# Patient Record
Sex: Female | Born: 1985
Health system: Southern US, Community
[De-identification: ages and names within clinical notes are randomized; demographics above are authoritative.]

## PROBLEM LIST (undated history)

## (undated) DIAGNOSIS — G43909 Migraine, unspecified, not intractable, without status migrainosus: Secondary | ICD-10-CM

## (undated) HISTORY — PX: TUBAL LIGATION: SHX77

---

## 2012-08-21 ENCOUNTER — Encounter (HOSPITAL_BASED_OUTPATIENT_CLINIC_OR_DEPARTMENT_OTHER): Payer: Self-pay | Admitting: *Deleted

## 2012-08-21 ENCOUNTER — Emergency Department (HOSPITAL_BASED_OUTPATIENT_CLINIC_OR_DEPARTMENT_OTHER)
Admission: EM | Admit: 2012-08-21 | Discharge: 2012-08-21 | Disposition: A | Discharge status: Home / Self Care | Payer: Self-pay | Attending: Emergency Medicine | Admitting: Emergency Medicine

## 2012-08-21 DIAGNOSIS — M549 Dorsalgia, unspecified: Secondary | ICD-10-CM | POA: Insufficient documentation

## 2012-08-21 DIAGNOSIS — A5901 Trichomonal vulvovaginitis: Secondary | ICD-10-CM | POA: Insufficient documentation

## 2012-08-21 LAB — URINALYSIS, ROUTINE W REFLEX MICROSCOPIC
Protein, ur: NEGATIVE mg/dL
Urobilinogen, UA: 1 mg/dL (ref 0.0–1.0)

## 2012-08-21 LAB — WET PREP, GENITAL

## 2012-08-21 LAB — URINE MICROSCOPIC-ADD ON

## 2012-08-21 MED ORDER — CEFTRIAXONE SODIUM 250 MG IJ SOLR
250.0000 mg | Freq: Once | INTRAMUSCULAR | Status: AC
  Administered 2012-08-21: 250 mg via INTRAMUSCULAR
  Filled 2012-08-21: qty 250

## 2012-08-21 MED ORDER — AZITHROMYCIN 250 MG PO TABS
1000.0000 mg | ORAL_TABLET | Freq: Once | ORAL | Status: AC
  Administered 2012-08-21: 1000 mg via ORAL
  Filled 2012-08-21: qty 4

## 2012-08-21 MED ORDER — METRONIDAZOLE 500 MG PO TABS
500.0000 mg | ORAL_TABLET | Freq: Once | ORAL | Status: AC
  Administered 2012-08-21: 500 mg via ORAL
  Filled 2012-08-21: qty 1

## 2012-08-21 MED ORDER — LIDOCAINE HCL (PF) 1 % IJ SOLN
INTRAMUSCULAR | Status: AC
  Administered 2012-08-21: 5 mL
  Filled 2012-08-21: qty 5

## 2012-08-21 MED ORDER — METRONIDAZOLE 500 MG PO TABS: 500.0000 mg | ORAL_TABLET | Freq: Two times a day (BID) | ORAL | Status: DC

## 2012-08-21 NOTE — ED Provider Notes (Signed)
History     CSN: [id number]  Arrival date & time 08/21/12  1304   None     Chief Complaint  Patient presents with  . Vaginal Discharge  . Dysuria    (Consider location/radiation/quality/duration/timing/severity/associated sxs/prior treatment) HPI Comments: C/o dysuria and vaginal d/c ~ 1 week.  D/c "smells bad".  Onset coincided with end of menses.  Denies prior STD's.  Last intercourse 8 days ago.  Partner denies sxs.  Pt denies fever, hematuria, frequency or urgency.  No other complaints.  Patient is a 26 y.o. female presenting with vaginal discharge and dysuria. The history is provided by the patient. No language interpreter was used.  Vaginal Discharge This is a new problem. The current episode started in the past 7 days. The problem occurs constantly. The problem has been unchanged. Associated symptoms include urinary symptoms. Pertinent negatives include no chills, fever, nausea or vomiting.  Dysuria  Pertinent negatives include no chills, no nausea, no vomiting, no frequency, no hematuria and no urgency.    History reviewed. No pertinent past medical history.  Past Surgical History  Procedure Date  . Cesarean section   . Tubal ligation     History reviewed. No pertinent family history.  History  Substance Use Topics  . Smoking status: Never Smoker   . Smokeless tobacco: Not on file  . Alcohol Use: No    OB History    Grav Para Term Preterm Abortions TAB SAB Ect Mult Living                  Review of Systems  Constitutional: Negative for fever and chills.  Gastrointestinal: Negative for nausea and vomiting.  Genitourinary: Positive for dysuria and vaginal discharge. Negative for urgency, frequency, hematuria, vaginal bleeding, vaginal pain and pelvic pain.  Musculoskeletal: Positive for back pain.  All other systems reviewed and are negative.    Allergies  Review of patient's allergies indicates no known allergies.  Home Medications   Current  Outpatient Rx  Name Route Sig Dispense Refill  . METRONIDAZOLE 500 MG PO TABS Oral Take 1 tablet (500 mg total) by mouth 2 (two) times daily. 14 tablet 0    BP 137/71  Pulse 85  Temp 98.4 F (36.9 C) (Oral)  Resp 16  Ht 5\' 2"  (1.575 m)  Wt 210 lb (95.255 kg)  BMI 38.41 kg/m2  SpO2 100%  LMP 08/15/2012  Physical Exam  Nursing note and vitals reviewed. Constitutional: She is oriented to person, place, and time. She appears well-developed and well-nourished. No distress.  HENT:  Head: Normocephalic and atraumatic.  Eyes: EOM are normal.  Neck: Normal range of motion.  Cardiovascular: Normal rate and regular rhythm.   Pulmonary/Chest: Effort normal.  Abdominal: Soft. She exhibits no distension. There is no tenderness. There is no rigidity, no guarding, no CVA tenderness, no tenderness at McBurney's point and negative Murphy's sign.  Genitourinary: Pelvic exam was performed with patient supine. There is no rash, tenderness, lesion or injury on the right labia. There is no rash, tenderness, lesion or injury on the left labia. Uterus is not tender. Cervix exhibits discharge. Cervix exhibits no motion tenderness. Right adnexum displays no mass, no tenderness and no fullness. Left adnexum displays no mass, no tenderness and no fullness. No erythema, tenderness or bleeding around the vagina. No foreign body around the vagina. No signs of injury around the vagina. Vaginal discharge found.       Significant thick white/yellow d/c accumulated in posterior fornix and present  around cervix.  No CMT or adnexal PT  Musculoskeletal: Normal range of motion. She exhibits no tenderness.       Lumbar back: She exhibits pain. She exhibits normal range of motion and no tenderness.       Back:  Neurological: She is alert and oriented to person, place, and time.  Skin: Skin is warm and dry.  Psychiatric: She has a normal mood and affect. Judgment normal.    ED Course  Procedures (including critical care  time)  Labs Reviewed  URINALYSIS, ROUTINE W REFLEX MICROSCOPIC - Abnormal; Notable for the following:    Leukocytes, UA MODERATE (*)     All other components within normal limits  URINE MICROSCOPIC-ADD ON - Abnormal; Notable for the following:    Squamous Epithelial / LPF FEW (*)     Bacteria, UA FEW (*)     All other components within normal limits  WET PREP, GENITAL - Abnormal; Notable for the following:    Trich, Wet Prep MANY (*)     Clue Cells Wet Prep HPF POC MANY (*)     WBC, Wet Prep HPF POC MANY (*)     All other components within normal limits  PREGNANCY, URINE  GC/CHLAMYDIA PROBE AMP, GENITAL  RPR   No results found.   1. Trichomonas vaginitis       MDM  Given rocephin and zithromax. Many trich on wet prep rx-flagyl 500 mg BID, 14 Have partner treated.        Evalina Field, [place] 08/21/12 [phone]

## 2012-08-21 NOTE — ED Notes (Signed)
Pt c/o painful urination and  vaginal discharge x 2 days

## 2012-08-22 LAB — RPR: RPR Ser Ql: NONREACTIVE

## 2012-08-22 LAB — GC/CHLAMYDIA PROBE AMP, GENITAL: Chlamydia, DNA Probe: NEGATIVE

## 2012-08-25 NOTE — ED Provider Notes (Signed)
Medical screening examination/treatment/procedure(s) were performed by non-physician practitioner and as supervising physician I was immediately available for consultation/collaboration.  Marq Rebello M Zahir Eisenhour, MD 08/25/12 2107 

## 2014-09-01 ENCOUNTER — Encounter (HOSPITAL_BASED_OUTPATIENT_CLINIC_OR_DEPARTMENT_OTHER): Payer: Self-pay

## 2014-09-01 ENCOUNTER — Emergency Department (HOSPITAL_BASED_OUTPATIENT_CLINIC_OR_DEPARTMENT_OTHER)
Admission: EM | Admit: 2014-09-01 | Discharge: 2014-09-01 | Discharge status: Against Medical Advice | Payer: Self-pay | Attending: Emergency Medicine | Admitting: Emergency Medicine

## 2014-09-01 DIAGNOSIS — R109 Unspecified abdominal pain: Secondary | ICD-10-CM | POA: Insufficient documentation

## 2014-09-01 DIAGNOSIS — M545 Low back pain: Secondary | ICD-10-CM | POA: Insufficient documentation

## 2014-09-01 DIAGNOSIS — Z3202 Encounter for pregnancy test, result negative: Secondary | ICD-10-CM | POA: Insufficient documentation

## 2014-09-01 LAB — URINALYSIS, ROUTINE W REFLEX MICROSCOPIC
Bilirubin Urine: NEGATIVE
GLUCOSE, UA: NEGATIVE mg/dL
KETONES UR: NEGATIVE mg/dL
NITRITE: NEGATIVE
PH: 7 (ref 5.0–8.0)
Protein, ur: 100 mg/dL — AB
SPECIFIC GRAVITY, URINE: 1.009 (ref 1.005–1.030)
Urobilinogen, UA: 0.2 mg/dL (ref 0.0–1.0)

## 2014-09-01 LAB — PREGNANCY, URINE: PREG TEST UR: NEGATIVE

## 2014-09-01 LAB — URINE MICROSCOPIC-ADD ON

## 2014-09-01 NOTE — ED Notes (Signed)
Didn't want to wait any longer. Will come back later

## 2014-09-01 NOTE — ED Notes (Addendum)
Patient here with lower abdominal pressure, dysuria and lower back pain x 3 days. Taking AZO with no relief. Also complains of vaginal discharge.

## 2015-07-05 ENCOUNTER — Encounter (HOSPITAL_BASED_OUTPATIENT_CLINIC_OR_DEPARTMENT_OTHER): Payer: Self-pay | Admitting: *Deleted

## 2015-07-05 ENCOUNTER — Emergency Department (HOSPITAL_BASED_OUTPATIENT_CLINIC_OR_DEPARTMENT_OTHER)
Admission: EM | Admit: 2015-07-05 | Discharge: 2015-07-05 | Disposition: A | Discharge status: Home / Self Care | Payer: Self-pay | Attending: Emergency Medicine | Admitting: Emergency Medicine

## 2015-07-05 DIAGNOSIS — Y998 Other external cause status: Secondary | ICD-10-CM | POA: Insufficient documentation

## 2015-07-05 DIAGNOSIS — Y9389 Activity, other specified: Secondary | ICD-10-CM | POA: Insufficient documentation

## 2015-07-05 DIAGNOSIS — S161XXA Strain of muscle, fascia and tendon at neck level, initial encounter: Secondary | ICD-10-CM | POA: Insufficient documentation

## 2015-07-05 DIAGNOSIS — T148XXA Other injury of unspecified body region, initial encounter: Secondary | ICD-10-CM

## 2015-07-05 DIAGNOSIS — Y92410 Unspecified street and highway as the place of occurrence of the external cause: Secondary | ICD-10-CM | POA: Insufficient documentation

## 2015-07-05 MED ORDER — KETOROLAC TROMETHAMINE 60 MG/2ML IM SOLN
60.0000 mg | Freq: Once | INTRAMUSCULAR | Status: AC
  Administered 2015-07-05: 60 mg via INTRAMUSCULAR
  Filled 2015-07-05: qty 2

## 2015-07-05 MED ORDER — METHOCARBAMOL 500 MG PO TABS: 500.0000 mg | ORAL_TABLET | Freq: Two times a day (BID) | ORAL | Status: DC | PRN

## 2015-07-05 MED ORDER — NAPROXEN 500 MG PO TABS: 500.0000 mg | ORAL_TABLET | Freq: Two times a day (BID) | ORAL | Status: DC
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
## 2015-07-05 NOTE — Discharge Instructions (Signed)
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
## 2015-11-06 ENCOUNTER — Emergency Department (HOSPITAL_BASED_OUTPATIENT_CLINIC_OR_DEPARTMENT_OTHER)
Admission: EM | Admit: 2015-11-06 | Discharge: 2015-11-06 | Disposition: A | Discharge status: Home / Self Care | Payer: PRIVATE HEALTH INSURANCE | Attending: Emergency Medicine | Admitting: Emergency Medicine

## 2015-11-06 ENCOUNTER — Encounter (HOSPITAL_BASED_OUTPATIENT_CLINIC_OR_DEPARTMENT_OTHER): Payer: Self-pay

## 2015-11-06 DIAGNOSIS — Z9889 Other specified postprocedural states: Secondary | ICD-10-CM | POA: Diagnosis not present

## 2015-11-06 DIAGNOSIS — Z9851 Tubal ligation status: Secondary | ICD-10-CM | POA: Diagnosis not present

## 2015-11-06 DIAGNOSIS — L02511 Cutaneous abscess of right hand: Secondary | ICD-10-CM | POA: Insufficient documentation

## 2015-11-06 DIAGNOSIS — L02512 Cutaneous abscess of left hand: Secondary | ICD-10-CM | POA: Insufficient documentation

## 2015-11-06 DIAGNOSIS — L0231 Cutaneous abscess of buttock: Secondary | ICD-10-CM | POA: Diagnosis not present

## 2015-11-06 DIAGNOSIS — L02211 Cutaneous abscess of abdominal wall: Secondary | ICD-10-CM | POA: Diagnosis not present

## 2015-11-06 LAB — CBG MONITORING, ED: GLUCOSE-CAPILLARY: 87 mg/dL (ref 65–99)

## 2015-11-06 MED ORDER — SULFAMETHOXAZOLE-TRIMETHOPRIM 800-160 MG PO TABS: 1.0000 | ORAL_TABLET | Freq: Two times a day (BID) | ORAL | Status: DC

## 2015-11-06 MED ORDER — CEPHALEXIN 500 MG PO CAPS: ORAL_CAPSULE | ORAL | Status: DC

## 2015-11-06 MED ORDER — SULFAMETHOXAZOLE-TRIMETHOPRIM 800-160 MG PO TABS
1.0000 | ORAL_TABLET | Freq: Once | ORAL | Status: AC
  Administered 2015-11-06: 1 via ORAL
  Filled 2015-11-06: qty 1

## 2015-11-06 MED ORDER — CEPHALEXIN 250 MG PO CAPS
1000.0000 mg | ORAL_CAPSULE | Freq: Once | ORAL | Status: AC
  Administered 2015-11-06: 1000 mg via ORAL
  Filled 2015-11-06: qty 4

## 2015-11-06 MED ORDER — LIDOCAINE HCL (PF) 1 % IJ SOLN
5.0000 mL | Freq: Once | INTRAMUSCULAR | Status: AC
  Administered 2015-11-06: 5 mL
  Filled 2015-11-06: qty 5

## 2015-11-06 MED ORDER — IBUPROFEN 800 MG PO TABS
800.0000 mg | ORAL_TABLET | Freq: Once | ORAL | Status: AC
  Administered 2015-11-06: 800 mg via ORAL
  Filled 2015-11-06: qty 1

## 2015-11-06 MED ORDER — IBUPROFEN 800 MG PO TABS: 800.0000 mg | ORAL_TABLET | Freq: Three times a day (TID) | ORAL | Status: DC

## 2015-11-06 NOTE — ED Provider Notes (Signed)
CSN: [id number]     Arrival date & time 11/06/15  1732 History   By signing my name below, I, Jarvis Morgan, attest that this documentation has been prepared under the direction and in the presence of No att. providers found. Electronically Signed: Jarvis Morgan, ED Scribe. 11/07/2015. 12:27 AM.    Chief Complaint  Patient presents with  . Abscess   The history is provided by the patient. No language interpreter was used.    HPI Comments: Morgan Woodward is a 30 y.o. female with no PMHxwho presents to the Emergency Department complaining of painful, hard, lumps to multiple areas of her body for 2 weeks. She reports she has a h/o of abscesses; her last abscess was 2 years ago and had to be incised. She has an area to her the dorsum of her right hand, left buttock, left gluteal cleft, right and left side of abdomen. Pt states the areas has been draining pus. She endorses associated subjective fever and chills. She has not taken any medication prior to arrival. 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 Pt denies any nausea, vomiting, coughing, sinus pressure, congestion, rhinorrhea, or other associated symptoms.    History reviewed. No pertinent past medical history. Past Surgical History  Procedure Laterality Date  . Cesarean section    . Tubal ligation     No family history on file. Social History  Substance Use Topics  . Smoking status: Never Smoker   . Smokeless tobacco: None  . Alcohol Use: No   OB History    No data available     Review of Systems 10 Systems reviewed and all are negative for acute change except as noted in the HPI.      Allergies  Review of patient's allergies indicates no known allergies.  Home Medications   Prior to Admission medications   Medication Sig Start Date End Date Taking? Authorizing Provider  cephALEXin (KEFLEX) 500 MG capsule 2 caps po bid x 7 days 11/06/15   Arby Barrette, MD   ibuprofen (ADVIL,MOTRIN) 800 MG tablet Take 1 tablet (800 mg total) by mouth 3 (three) times daily. 11/06/15   Arby Barrette, MD  sulfamethoxazole-trimethoprim (BACTRIM DS,SEPTRA DS) 800-160 MG tablet Take 1 tablet by mouth 2 (two) times daily. 11/06/15   Arby Barrette, MD   Triage Vitals: BP 138/70 mmHg  Pulse 70  Temp(Src) 98.4 F (36.9 C) (Oral)  Resp 18  Ht 5\' 2"  (1.575 m)  Wt 228 lb (103.42 kg)  BMI 41.69 kg/m2  SpO2 99%  LMP 10/28/2014  Physical Exam  Constitutional: She is oriented to person, place, and time. She appears well-developed and well-nourished. No distress.  HENT:  Head: Normocephalic and atraumatic.  Eyes: Conjunctivae and EOM are normal.  Neck: Neck supple. No tracheal deviation present.  Cardiovascular: Normal rate.   Pulmonary/Chest: Effort normal. No respiratory distress.  Musculoskeletal: Normal range of motion.  Neurological: She is alert and oriented to person, place, and time.  Skin: Skin is warm and dry.  Patient has several firm nodules on the buttock and on her abdomen. The image shows the appearance. She reports these have spontaneously drained in the past. None of those have fluctuance or erythema suggestive cellulitis at this time. The area imaged on the patient's finger however does have a small area of fluctuance.  Psychiatric: She has a normal mood and affect. Her behavior is normal.  Nursing note and vitals  reviewed.       ED Course  Procedures (including critical care time)  DIAGNOSTIC STUDIES: Oxygen Saturation is 99% on RA, normal by my interpretation.    COORDINATION OF CARE: 6:25 PM- Discussed I&D procedure with pt. Pt advised of plan for treatment and pt agrees.  12:27 AM INCISION AND DRAINAGE PROCEDURE NOTE: Patient identification was confirmed and verbal consent was obtained. This procedure was performed by No att. providers found at 12:27 AM. Site: Right third digit hand Sterile procedures observed Needle size:  22 Anesthetic used (type and amt): 1% lidocaine 2ml Blade size: 11 Drainage: Approximately 0.5 mL of pus Complexity: Complex Packing used: None Site anesthetized, incision made over site, wound drained and explored loculations, covered with dry, sterile dressing.  Pt tolerated procedure well without complications.  Instructions for care discussed verbally and pt provided with additional written instructions for homecare and f/u.    Labs Review Labs Reviewed  WOUND CULTURE  CBG MONITORING, ED    Imaging Review No results found.    EKG Interpretation None      MDM   Final diagnoses:  Abscess of finger, left   Patient has an abscess on the finger as outlined. This was drained. At this time she does not appear to have Tenosynovitis. She has been getting recurrent small abscesses that have been draining spontaneously. 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 Community-acquired staph considered as etiology. At this time she will be placed on both Keflex and Bactrim pending cultures of the wound. The patient is well and nontoxic in appearance. She is not diabetic. A spot CBG was checked and is 86. She is instructed for 24 follow-up for wound check.    Arby BarretteMarcy Demetrios Byron, MD 11/07/15 0030

## 2015-11-06 NOTE — Discharge Instructions (Signed)
Abscess °An abscess is an infected area that contains a collection of pus and debris. It can occur in almost any part of the body. An abscess is also known as a furuncle or boil. °CAUSES  °An abscess occurs when tissue gets infected. This can occur from blockage of oil or sweat glands, infection of hair follicles, or a minor injury to the skin. As the body tries to fight the infection, pus collects in the area and creates pressure under the skin. This pressure causes pain. People with weakened immune systems have difficulty fighting infections and get certain abscesses more often.  °SYMPTOMS °Usually an abscess develops on the skin and becomes a painful mass that is red, warm, and tender. If the abscess forms under the skin, you may feel a moveable soft area under the skin. Some abscesses break open (rupture) on their own, but most will continue to get worse without care. The infection can spread deeper into the body and eventually into the bloodstream, causing you to feel ill.  °DIAGNOSIS  °Your caregiver will take your medical history and perform a physical exam. A sample of fluid may also be taken from the abscess to determine what is causing your infection. °TREATMENT  °Your caregiver may prescribe antibiotic medicines to fight the infection. However, taking antibiotics alone usually does not cure an abscess. Your caregiver may need to make a small cut (incision) in the abscess to drain the pus. In some cases, gauze is packed into the abscess to reduce pain and to continue draining the area. °HOME CARE INSTRUCTIONS  °· Only take over-the-counter or prescription medicines for pain, discomfort, or fever as directed by your caregiver. °· If you were prescribed antibiotics, take them as directed. Finish them even if you start to feel better. °· If gauze is used, follow your caregiver's directions for changing the gauze. °· To avoid spreading the infection: °· Keep your draining abscess covered with a  bandage. °· Wash your hands well. °· Do not share personal care items, towels, or whirlpools with others. °· Avoid skin contact with others. °· Keep your skin and clothes clean around the abscess. °· Keep all follow-up appointments as directed by your caregiver. °SEEK MEDICAL CARE IF:  °· You have increased pain, swelling, redness, fluid drainage, or bleeding. °· You have muscle aches, chills, or a general ill feeling. °· You have a fever. °MAKE SURE YOU:  °· Understand these instructions. °· Will watch your condition. °· Will get help right away if you are not doing well or get worse. °  °This information is not intended to replace advice given to you by your health care provider. Make sure you discuss any questions you have with your health care provider. °  °Document Released: 07/28/2005 Document Revised: 04/18/2012 Document Reviewed: 12/31/2011 °Elsevier Interactive Patient Education ©2016 [employer] ° °Incision and Drainage °Incision and drainage is a procedure in which a sac-like structure (cystic structure) is opened and drained. The area to be drained usually contains material such as pus, fluid, or blood.  °LET YOUR CAREGIVER KNOW ABOUT:  °· Allergies to medicine. °· Medicines taken, including vitamins, herbs, eyedrops, over-the-counter medicines, and creams. °· Use of steroids (by mouth or creams). °· Previous problems with anesthetics or numbing medicines. °· History of bleeding problems or blood clots. °· Previous surgery. °· Other health problems, including diabetes and kidney problems. °· Possibility of pregnancy, if this applies. °RISKS AND COMPLICATIONS °· Pain. °· Bleeding. °· Scarring. °· Infection. °BEFORE THE PROCEDURE  °  You may need to have an ultrasound or other imaging tests to see how large or deep your cystic structure is. Blood tests may also be used to determine if you have an infection or how severe the infection is. You may need to have a tetanus shot. °PROCEDURE  °The affected area  is cleaned with a cleaning fluid. The cyst area will then be numbed with a medicine (local anesthetic). A small incision will be made in the cystic structure. A syringe or catheter may be used to drain the contents of the cystic structure, or the contents may be squeezed out. The area will then be flushed with a cleansing solution. After cleansing the area, it is often gently packed with a gauze or another wound dressing. Once it is packed, it will be covered with gauze and tape or some other type of wound dressing.  °AFTER THE PROCEDURE  °· Often, you will be allowed to go home right after the procedure. °· You may be given antibiotic medicine to prevent or heal an infection. °· If the area was packed with gauze or some other wound dressing, you will likely need to come back in 1 to 2 days to get it removed. °· The area should heal in about 14 days. °  °This information is not intended to replace advice given to you by your health care provider. Make sure you discuss any questions you have with your health care provider. °  °Document Released: 04/13/2001 Document Revised: 04/18/2012 Document Reviewed: 12/13/2011 °Elsevier Interactive Patient Education ©2016 [employer] ° °

## 2015-11-06 NOTE — ED Notes (Signed)
Multiple abscess x 2 weeks

## 2016-07-31 ENCOUNTER — Encounter (HOSPITAL_BASED_OUTPATIENT_CLINIC_OR_DEPARTMENT_OTHER): Payer: Self-pay | Admitting: Emergency Medicine

## 2016-07-31 ENCOUNTER — Emergency Department (HOSPITAL_BASED_OUTPATIENT_CLINIC_OR_DEPARTMENT_OTHER)
Admission: EM | Admit: 2016-07-31 | Discharge: 2016-07-31 | Disposition: A | Discharge status: Home / Self Care | Payer: PRIVATE HEALTH INSURANCE | Attending: Emergency Medicine | Admitting: Emergency Medicine

## 2016-07-31 DIAGNOSIS — L080 Pyoderma: Secondary | ICD-10-CM | POA: Insufficient documentation

## 2016-07-31 MED ORDER — CLINDAMYCIN HCL 150 MG PO CAPS: 300.0000 mg | ORAL_CAPSULE | Freq: Four times a day (QID) | ORAL | 0 refills | Status: AC

## 2016-07-31 NOTE — ED Triage Notes (Signed)
Pt reports blister-like "boils popping up all over" her body for past 2 months.

## 2016-07-31 NOTE — ED Provider Notes (Signed)
[facility] Provider Note   CSN: [id number] Arrival date & time: 07/31/16  1630   By signing my name below, I, [name], attest that this documentation has been prepared under the direction and in the presence of Laurence Spates, MD . Electronically Signed: [name], Scribe. 07/31/2016. 4:41 PM.  History   Chief Complaint Chief Complaint  Patient presents with  . Rash   The history is provided by the patient. No language interpreter was used.    HPI Comments:  Morgan Woodward is a 30 y.o. female with no pertinent PMHx who presents to the Emergency Department complaining of chronic unchanged diffuse rash over the past 2 months. Pt describes the rash as being itchy and worse around her lower torso and bottom. Some areas of involvement on her legs but no mucous membrane involvement. Pt denies any fever, vomiting, diarrhea, abdominal pain, vaginal bleeding, vaginal discharge, joint pain or weight loss. She notes that she has not changed her soap or detergent, and that none of her normal contacts have any rashes.   History reviewed. No pertinent past medical history.  There are no active problems to display for this patient.   Past Surgical History:  Procedure Laterality Date  . CESAREAN SECTION    . TUBAL LIGATION      OB History    No data available       Home Medications    Prior to Admission medications   Medication Sig Start Date End Date Taking? Authorizing Provider  clindamycin (CLEOCIN) 150 MG capsule Take 2 capsules (300 mg total) by mouth 4 (four) times daily. 07/31/16 08/07/16  Laurence Spates, MD    Family History No family history on file.  Social History Social History  Substance Use Topics  . Smoking status: Never Smoker  . Smokeless tobacco: Never Used  . Alcohol use No     Allergies   Review of patient's allergies indicates no known allergies.   Review of Systems Review of Systems  Skin: Positive for rash.   10  systems reviewed and all are negative for acute change except as noted in the HPI.  Physical Exam Updated Vital Signs BP 130/90 (BP Location: Left Arm)   Pulse 83   Temp 98 F (36.7 C) (Oral)   Resp 18   Ht 5\' 2"  (1.575 m)   Wt 231 lb (104.8 kg)   LMP 07/02/2016   SpO2 100%   BMI 42.25 kg/m   Physical Exam  Constitutional: She is oriented to person, place, and time. She appears well-developed and well-nourished. No distress.  HENT:  Head: Normocephalic and atraumatic.  Moist mucous membranes  Eyes: Conjunctivae are normal. Pupils are equal, round, and reactive to light.  Neck: Neck supple.  Cardiovascular: Normal rate, regular rhythm and normal heart sounds.   No murmur heard. Pulmonary/Chest: Effort normal and breath sounds normal.  Abdominal: Soft. Bowel sounds are normal. She exhibits no distension. There is no tenderness.  Genitourinary:  Genitourinary Comments: Scattered areas of ruptured pustules of varying stages in healing along gluteal fold, upper thighs  Musculoskeletal: She exhibits no edema.  Neurological: She is alert and oriented to person, place, and time.  Fluent speech  Skin: Skin is warm and dry.  2 discrete pustules on anterior R lower leg w/ small surrounding area of erythema, mild tenderness; scattered areas of scabbing and ruptured pustules of varying stages of healing on buttocks, flanks, and inner thighs  Psychiatric: She has a normal mood and  affect. Judgment normal.  Nursing note and vitals reviewed.  Chaperone was present during exam.   ED Treatments / Results  DIAGNOSTIC STUDIES:  Oxygen Saturation is 100% on RA, normal by my interpretation.    COORDINATION OF CARE:  5:18 PM Discussed treatment plan with pt at bedside which include bacterial culture and abx and pt agreed to plan.  Labs (all labs ordered are listed, but only abnormal results are displayed) Labs Reviewed  AEROBIC CULTURE (SUPERFICIAL SPECIMEN)    EKG  EKG  Interpretation None       Radiology No results found.  Procedures Procedures (including critical care time)  Medications Ordered in ED Medications - No data to display   Initial Impression / Assessment and Plan / ED Course  I have reviewed the triage vital signs and the nursing notes.    Clinical Course   Pt with 2 months of scattered pustules that itch and rupture, heal and then recur. She has had no systemic symptoms and is well-appearing on exam with reassuring vital signs. No signs of systemic illness, no joint pain, no mucous membrane involvement. No identifiable exposures and this does not appear c/w contact dermatitis. I unroofed 2 pustules which yielded small amount of pus, wound culture sent. Will treat with MRSA coverage given appearance and discussed importance of follow-up PCP particularly if no improvement after antibiotic course. I extensively reviewed return precautions including worsening redness, fever, or new symptoms. Patient voiced understanding and was discharged in satisfactory condition. Final Clinical Impressions(s) / ED Diagnoses   Final diagnoses:  Pustular rash    New Prescriptions New Prescriptions   CLINDAMYCIN (CLEOCIN) 150 MG CAPSULE    Take 2 capsules (300 mg total) by mouth 4 (four) times daily.  I personally performed the services described in this documentation, which was scribed in my presence. The recorded information has been reviewed and is accurate.    Laurence Spatesachel Morgan Leviathan Macera, MD 07/31/16 [phone]

## 2016-08-04 ENCOUNTER — Telehealth (HOSPITAL_COMMUNITY): Payer: Self-pay

## 2016-08-04 LAB — AEROBIC CULTURE  (SUPERFICIAL SPECIMEN): SPECIAL REQUESTS: NORMAL

## 2016-08-04 LAB — AEROBIC CULTURE W GRAM STAIN (SUPERFICIAL SPECIMEN)

## 2016-08-04 NOTE — Telephone Encounter (Signed)
Post ED Visit - Positive Culture Follow-up  Culture report reviewed by antimicrobial stewardship pharmacist:  []  Enzo BiNathan Batchelder, Pharm.D. []  Celedonio MiyamotoJeremy Frens, Pharm.D., BCPS []  Garvin FilaMike Maccia, Pharm.D. []  Georgina PillionElizabeth Martin, Pharm.D., BCPS []  EsperanzaMinh Pham, [address]Pharm[address]D., BCPS, AAHIVP []  Estella HuskMichelle Turner, Pharm.D., BCPS, AAHIVP []  Tennis Mustassie Stewart, Pharm.D. []  Sherle Poeob Vincent, [address]Pharm.D. X  Renee Ackley, Pharm.D.  Positive culture, few Staph. Aureus Treated with Clindamycin, organism sensitive to the same and no further patient follow-up is required at this time.  Morgan Woodward, Morgan Woodward 08/04/2016, 9:29 AM

## 2016-08-05 ENCOUNTER — Telehealth (HOSPITAL_COMMUNITY): Payer: Self-pay

## 2016-08-05 NOTE — Telephone Encounter (Signed)
Duplicate report from 08/04/2016 charted by same Clinical research associatewriter

## 2018-01-26 ENCOUNTER — Emergency Department (HOSPITAL_BASED_OUTPATIENT_CLINIC_OR_DEPARTMENT_OTHER)
Admission: EM | Admit: 2018-01-26 | Discharge: 2018-01-26 | Disposition: A | Discharge status: Home / Self Care | Payer: PRIVATE HEALTH INSURANCE | Attending: Emergency Medicine | Admitting: Emergency Medicine

## 2018-01-26 ENCOUNTER — Encounter (HOSPITAL_BASED_OUTPATIENT_CLINIC_OR_DEPARTMENT_OTHER): Payer: Self-pay | Admitting: *Deleted

## 2018-01-26 ENCOUNTER — Other Ambulatory Visit: Payer: Self-pay

## 2018-01-26 DIAGNOSIS — J111 Influenza due to unidentified influenza virus with other respiratory manifestations: Secondary | ICD-10-CM | POA: Insufficient documentation

## 2018-01-26 DIAGNOSIS — R69 Illness, unspecified: Secondary | ICD-10-CM

## 2018-01-26 LAB — RAPID STREP SCREEN (MED CTR MEBANE ONLY): Streptococcus, Group A Screen (Direct): NEGATIVE

## 2018-01-26 MED ORDER — IBUPROFEN 400 MG PO TABS
600.0000 mg | ORAL_TABLET | Freq: Once | ORAL | Status: AC
  Administered 2018-01-26: 600 mg via ORAL
  Filled 2018-01-26: qty 1

## 2018-01-26 MED ORDER — NAPROXEN 500 MG PO TABS: 500.0000 mg | ORAL_TABLET | Freq: Two times a day (BID) | ORAL | 0 refills | Status: AC | PRN

## 2018-01-26 MED ORDER — OSELTAMIVIR PHOSPHATE 75 MG PO CAPS: 75.0000 mg | ORAL_CAPSULE | Freq: Two times a day (BID) | ORAL | 0 refills | Status: AC

## 2018-01-26 NOTE — Discharge Instructions (Signed)
We think your symptoms are likely due to influenza.  Your test for strep pharyngitis is negative.  We gave you a prescription for Tamiflu.  This will shorten his symptoms by 16-24-hour.  But it also has side effect as we discussed.  - A tablespoonful of honey before bedtime is helpful for cough - Get plenty of rest and adequate hydration. - Consume warm fluids (soup or tea). It relieves stuffy nose, and to loosen phlegm. - Can try saline nasal spray or a Neti Pot for stuffy nose  CONTACT YOUR DOCTOR IF YOU EXPERIENCE ANY OF THE FOLLOWING: - Shest pain, shortness of breath or  not able to keep down food or fluids.  - Your symptoms persist longer than 2 weeks

## 2018-01-26 NOTE — ED Triage Notes (Signed)
Pt has a sore throat, headache, and generalized body aches for 2 days.

## 2018-01-26 NOTE — ED Provider Notes (Signed)
[facility] EMERGENCY DEPARTMENT Provider Note   CSN: [id number] Arrival date & time: 01/26/18  1801     History   Chief Complaint Chief Complaint  Patient presents with  . Sore Throat  . Headache  . Generalized Body Aches    HPI Morgan Woodward is a 32 y.o. female with no significant past medical history who presents with sore throat, headache and myalgia for 1 day.  She also reports fever, chills, nasal congestion, postnasal dripping and cough.  Cough is mild and intermittently productive with whitish to yellowish phlegm.  She denies hemoptysis.  She denies rhinorrhea, chest pain, shortness of breath, sick contacts.  She has not had a flu vaccine this year.  He also reports nausea but denies emesis, abdominal pain or diarrhea.  She denies dysuria.  She also reports some photophobia.  She also reports neck pain mainly anteriorly. Denies smoking cigarettes, drinking alcohol or recreational drug use.  HPI  History reviewed. No pertinent past medical history.  There are no active problems to display for this patient.   Past Surgical History:  Procedure Laterality Date  . CESAREAN SECTION    . TUBAL LIGATION       OB History   None      Home Medications    Prior to Admission medications   Not on File    Family History History reviewed. No pertinent family history.  Social History Social History   Tobacco Use  . Smoking status: Never Smoker  . Smokeless tobacco: Never Used  Substance Use Topics  . Alcohol use: No  . Drug use: No     Allergies   Patient has no known allergies.   Review of Systems Review of Systems  Constitutional: Positive for chills and fever. Negative for appetite change and diaphoresis.  HENT: Positive for congestion, postnasal drip and sore throat. Negative for dental problem, rhinorrhea and trouble swallowing.   Eyes: Negative for visual disturbance.  Respiratory: Positive for cough. Negative for chest tightness and  shortness of breath.   Cardiovascular: Negative for chest pain, palpitations and leg swelling.  Gastrointestinal: Positive for nausea. Negative for abdominal pain, blood in stool, constipation, diarrhea and vomiting.  Genitourinary: Negative for dysuria, frequency, hematuria, menstrual problem and vaginal discharge.  Musculoskeletal: Positive for myalgias and neck pain. Negative for arthralgias and neck stiffness.  Skin: Negative for rash.  Neurological: Positive for headaches.  Psychiatric/Behavioral: Negative for dysphoric mood.   Physical Exam Updated Vital Signs BP (!) 150/85 (BP Location: Left Arm)   Pulse (!) 116   Temp (!) 100.4 F (38 C) (Oral)   Resp (!) 24   Ht 5\' 2"  (1.575 m)   Wt 103.7 kg (228 lb 9.9 oz)   LMP 01/20/2018 (Exact Date)   SpO2 98%   BMI 41.81 kg/m   Physical Exam GEN: Appears well but uncomfortable. Head: normocephalic and atraumatic  Eyes: conjunctiva without injection. Sclera anicteric.  No photophobia. Nares: no rhinorrhea. Some swollen turbinates and erythema of nasal mucosa Oropharynx: MMM. Some erythema but no exudation or petechiae.  Uvula midline HEM: Positive for bilateral anterior cervical triangle lymphadenopathy CVS: RRR, nl s1 & s2, no murmurs, no edema RESP: no IWOB, good air movement bilaterally, CTAB GI: BS present & normal, soft, NTND GU: no suprapubic or CVA tenderness MSK: no focal tenderness or notable swelling.  Full range of motion in neck without pain.  No stiffness or nuchal rigidity.  Negative Kernig or Brudzinski signs. SKIN: no apparent skin lesion  ENDO: negative thyromegally NEURO: alert and oiented appropriately, no gross deficits   PSYCH: euthymic mood with congruent affect   ED Treatments / Results  Labs (all labs ordered are listed, but only abnormal results are displayed) Labs Reviewed  RAPID STREP SCREEN (NOT AT [facility])    EKG None  Radiology No results found.  Procedures Procedures (including critical  care time)  Medications Ordered in ED Medications  ibuprofen (ADVIL,MOTRIN) tablet 600 mg (has no administration in time range)     Initial Impression / Assessment and Plan / ED Course  I have reviewed the triage vital signs and the nursing notes.  Pertinent labs & imaging results that were available during my care of the patient were reviewed by me and considered in my medical decision making (see chart for details).    32 year old female with no significant past medical history who presented with a 1 day of influenza-like illness.  Rapid strep negative.  She is well-appearing for meningitis.  She has no nuchal rigidity autophobia on my exam.  Cardiopulmonary exam within normal limits.  She was initially febrile, tachypneic and tachycardic.  Tachypnea and tachycardia resolved with ibuprofen.  Symptoms also improved.  She is still within the window to benefit from Tamiflu.  She also has obesity.  After discussing the benefit arteries, she chose to try Tamiflu.  I gave a prescription for Tamiflu 25 mg twice daily for 5 days and naproxen 500 mg twice daily for pain and headache.   Final Clinical Impressions(s) / ED Diagnoses   Final diagnoses:  None    ED Discharge Orders    None       Almon HerculesGonfa, Shail Urbas T, MD 01/26/18 2038    Maia PlanLong, Joshua G, MD 01/27/18 1221

## 2018-01-29 LAB — CULTURE, GROUP A STREP (THRC)

## 2019-10-04 ENCOUNTER — Other Ambulatory Visit: Payer: Self-pay

## 2019-10-04 ENCOUNTER — Encounter (HOSPITAL_BASED_OUTPATIENT_CLINIC_OR_DEPARTMENT_OTHER): Payer: Self-pay

## 2019-10-04 ENCOUNTER — Emergency Department (HOSPITAL_BASED_OUTPATIENT_CLINIC_OR_DEPARTMENT_OTHER)
Admission: EM | Admit: 2019-10-04 | Discharge: 2019-10-04 | Disposition: A | Discharge status: Home / Self Care | Payer: PRIVATE HEALTH INSURANCE | Attending: Emergency Medicine | Admitting: Emergency Medicine

## 2019-10-04 ENCOUNTER — Emergency Department (HOSPITAL_BASED_OUTPATIENT_CLINIC_OR_DEPARTMENT_OTHER): Payer: PRIVATE HEALTH INSURANCE

## 2019-10-04 DIAGNOSIS — R000 Tachycardia, unspecified: Secondary | ICD-10-CM | POA: Diagnosis not present

## 2019-10-04 DIAGNOSIS — R42 Dizziness and giddiness: Secondary | ICD-10-CM | POA: Diagnosis not present

## 2019-10-04 DIAGNOSIS — R110 Nausea: Secondary | ICD-10-CM | POA: Insufficient documentation

## 2019-10-04 DIAGNOSIS — R519 Headache, unspecified: Secondary | ICD-10-CM | POA: Diagnosis present

## 2019-10-04 HISTORY — DX: Migraine, unspecified, not intractable, without status migrainosus: G43.909

## 2019-10-04 LAB — BASIC METABOLIC PANEL
Anion gap: 8 (ref 5–15)
BUN: 8 mg/dL (ref 6–20)
CO2: 23 mmol/L (ref 22–32)
Calcium: 8.7 mg/dL — ABNORMAL LOW (ref 8.9–10.3)
Chloride: 106 mmol/L (ref 98–111)
Creatinine, Ser: 0.9 mg/dL (ref 0.44–1.00)
GFR calc Af Amer: >60 mL/min (ref >60)
GFR calc non Af Amer: >60 mL/min (ref >60)
Glucose, Bld: 112 mg/dL — ABNORMAL HIGH (ref 70–99)
Potassium: 3.1 mmol/L — ABNORMAL LOW (ref 3.5–5.1)
Sodium: 137 mmol/L (ref 135–145)

## 2019-10-04 LAB — CBC WITH DIFFERENTIAL/PLATELET
Abs Immature Granulocytes: 0.04 10*3/uL (ref 0.00–0.07)
Basophils Absolute: 0 10*3/uL (ref 0.0–0.1)
Basophils Relative: 0 %
Eosinophils Absolute: 0.1 10*3/uL (ref 0.0–0.5)
Eosinophils Relative: 2 %
HCT: 32.6 % — ABNORMAL LOW (ref 36.0–46.0)
Hemoglobin: 10.3 g/dL — ABNORMAL LOW (ref 12.0–15.0)
Immature Granulocytes: 1 %
Lymphocytes Relative: 42 %
Lymphs Abs: 3.1 10*3/uL (ref 0.7–4.0)
MCH: 25.6 pg — ABNORMAL LOW (ref 26.0–34.0)
MCHC: 31.6 g/dL (ref 30.0–36.0)
MCV: 80.9 fL (ref 80.0–100.0)
Monocytes Absolute: 0.6 10*3/uL (ref 0.1–1.0)
Monocytes Relative: 8 %
Neutro Abs: 3.5 10*3/uL (ref 1.7–7.7)
Neutrophils Relative %: 47 %
Platelets: 456 10*3/uL — ABNORMAL HIGH (ref 150–400)
RBC: 4.03 MIL/uL (ref 3.87–5.11)
RDW: 14.6 % (ref 11.5–15.5)
WBC: 7.3 10*3/uL (ref 4.0–10.5)
nRBC: 0 % (ref 0.0–0.2)

## 2019-10-04 LAB — TROPONIN I (HIGH SENSITIVITY): Troponin I (High Sensitivity): 2 ng/L (ref <18)

## 2019-10-04 LAB — D-DIMER, QUANTITATIVE (NOT AT ARMC): D-Dimer, Quant: 0.94 ug/mL-FEU — ABNORMAL HIGH (ref 0.00–0.50)

## 2019-10-04 MED ORDER — KETOROLAC TROMETHAMINE 15 MG/ML IJ SOLN
15.0000 mg | Freq: Once | INTRAMUSCULAR | Status: AC
Start: 2019-10-04 — End: 2019-10-04
  Administered 2019-10-04: 16:00:00 15 mg via INTRAVENOUS
  Filled 2019-10-04: qty 1

## 2019-10-04 MED ORDER — KETOROLAC TROMETHAMINE 60 MG/2ML IM SOLN: 60.0000 mg | Freq: Once | INTRAMUSCULAR | Status: DC

## 2019-10-04 MED ORDER — IOHEXOL 350 MG/ML SOLN
100.0000 mL | Freq: Once | INTRAVENOUS | Status: AC | PRN
  Administered 2019-10-04: 18:00:00 100 mL via INTRAVENOUS

## 2019-10-04 MED ORDER — LORAZEPAM 2 MG/ML IJ SOLN
1.0000 mg | Freq: Once | INTRAMUSCULAR | Status: AC
  Administered 2019-10-04: 16:00:00 1 mg via INTRAVENOUS

## 2019-10-04 MED ORDER — DIPHENHYDRAMINE HCL 50 MG/ML IJ SOLN
25.0000 mg | Freq: Once | INTRAMUSCULAR | Status: AC
  Administered 2019-10-04: 16:00:00 25 mg via INTRAVENOUS
  Filled 2019-10-04: qty 1

## 2019-10-04 MED ORDER — DOXYCYCLINE HYCLATE 100 MG PO CAPS: 100.0000 mg | ORAL_CAPSULE | Freq: Two times a day (BID) | ORAL | 0 refills | Status: AC

## 2019-10-04 MED ORDER — POTASSIUM CHLORIDE CRYS ER 20 MEQ PO TBCR
40.0000 meq | EXTENDED_RELEASE_TABLET | Freq: Once | ORAL | Status: AC
  Administered 2019-10-04: 19:00:00 40 meq via ORAL
  Filled 2019-10-04: qty 2

## 2019-10-04 MED ORDER — LORAZEPAM 2 MG/ML IJ SOLN
INTRAMUSCULAR | Status: AC
  Filled 2019-10-04: qty 1

## 2019-10-04 MED ORDER — DIPHENHYDRAMINE HCL 50 MG/ML IJ SOLN
25.0000 mg | Freq: Once | INTRAMUSCULAR | Status: AC
  Administered 2019-10-04: 16:00:00 25 mg via INTRAVENOUS

## 2019-10-04 MED ORDER — METOCLOPRAMIDE HCL 5 MG/ML IJ SOLN
10.0000 mg | Freq: Once | INTRAMUSCULAR | Status: AC
  Administered 2019-10-04: 10 mg via INTRAVENOUS
  Filled 2019-10-04: qty 2

## 2019-10-04 MED ORDER — SODIUM CHLORIDE 0.9 % IV BOLUS
1000.0000 mL | Freq: Once | INTRAVENOUS | Status: AC
  Administered 2019-10-04: 16:00:00 1000 mL via INTRAVENOUS

## 2019-10-04 MED ORDER — METOCLOPRAMIDE HCL 5 MG/ML IJ SOLN: 10.0000 mg | Freq: Once | INTRAMUSCULAR | Status: DC

## 2019-10-04 MED ORDER — DIPHENHYDRAMINE HCL 50 MG/ML IJ SOLN: 25.0000 mg | Freq: Once | INTRAMUSCULAR | Status: DC

## 2019-10-04 MED ORDER — DIPHENHYDRAMINE HCL 50 MG/ML IJ SOLN
INTRAMUSCULAR | Status: AC
  Administered 2019-10-04: 25 mg via INTRAVENOUS
  Filled 2019-10-04: qty 1

## 2019-10-04 NOTE — ED Notes (Signed)
Patient transported to CT 

## 2019-10-04 NOTE — ED Provider Notes (Signed)
[facility] EMERGENCY DEPARTMENT Provider Note   CSN: [id number] Arrival date & time: 10/04/19  1429     History   Chief Complaint Chief Complaint  Patient presents with  . Headache    HPI Morgan Woodward is a 33 y.o. female.     Patient here with gradual onset headache for the past 3 days.  She describes pain in the back of her head that radiates diffusely.  She denies thunderclap onset.  Associated with nausea but no vomiting.  Also has intermittent room spinning dizziness that comes and goes.  States this headache feels similar to previous migraines but she does not normally have the dizziness.  She is been trying ibuprofen, Tylenol and aspirin without relief.  Denies any focal weakness, numbness, tingling.  No difficulty speaking or difficulty swallowing.  No visual changes.  No fever.  No neck pain or stiffness. She states she has been diagnosed with a migraine in the past but has not prescribed any medications.  She denies thunderclap onset.  She describes intermittent spinning sensation especially with position change.  The history is provided by the patient.  Headache Associated symptoms: dizziness and photophobia   Associated symptoms: no abdominal pain, no congestion, no cough, no fever, no myalgias, no nausea, no numbness, no vomiting and no weakness     Past Medical History:  Diagnosis Date  . Migraines     There are no active problems to display for this patient.   Past Surgical History:  Procedure Laterality Date  . CESAREAN SECTION    . TUBAL LIGATION       OB History   No obstetric history on file.      Home Medications    Prior to Admission medications   Not on File    Family History No family history on file.  Social History Social History   Tobacco Use  . Smoking status: Never Smoker  . Smokeless tobacco: Never Used  Substance Use Topics  . Alcohol use: No  . Drug use: No     Allergies   Patient has no known  allergies.   Review of Systems Review of Systems  Constitutional: Negative for activity change, appetite change, diaphoresis and fever.  HENT: Negative for congestion and rhinorrhea.   Eyes: Positive for photophobia. Negative for visual disturbance.  Respiratory: Negative for cough, chest tightness and shortness of breath.   Cardiovascular: Negative for chest pain.  Gastrointestinal: Negative for abdominal pain, nausea and vomiting.  Genitourinary: Negative for dysuria and hematuria.  Musculoskeletal: Negative for arthralgias and myalgias.  Skin: Negative for wound.  Neurological: Positive for dizziness, light-headedness and headaches. Negative for weakness and numbness.   all other systems are negative except as noted in the HPI and PMH.     Physical Exam Updated Vital Signs BP (!) 146/82 (BP Location: Left Arm)   Pulse 88   Temp 98 F (36.7 C)   Resp 18   Ht 5\' 2"  (1.575 m)   Wt 110.7 kg   LMP 09/23/2019 (Approximate)   SpO2 100%   BMI 44.63 kg/m   Physical Exam Vitals signs and nursing note reviewed.  Constitutional:      General: She is not in acute distress.    Appearance: She is well-developed. She is obese.  HENT:     Head: Normocephalic and atraumatic.     Mouth/Throat:     Pharynx: No oropharyngeal exudate.  Eyes:     Conjunctiva/sclera: Conjunctivae normal.  Pupils: Pupils are equal, round, and reactive to light.  Neck:     Musculoskeletal: Normal range of motion and neck supple.     Comments: No meningismus. Cardiovascular:     Rate and Rhythm: Normal rate and regular rhythm.     Heart sounds: Normal heart sounds. No murmur.  Pulmonary:     Effort: Pulmonary effort is normal. No respiratory distress.     Breath sounds: Normal breath sounds.  Abdominal:     Palpations: Abdomen is soft.     Tenderness: There is no abdominal tenderness. There is no guarding or rebound.  Musculoskeletal: Normal range of motion.        General: No tenderness.  Skin:     General: Skin is warm.     Capillary Refill: Capillary refill takes less than 2 seconds.  Neurological:     General: No focal deficit present.     Mental Status: She is alert and oriented to person, place, and time. Mental status is at baseline.     Cranial Nerves: No cranial nerve deficit.     Motor: No abnormal muscle tone.     Coordination: Coordination normal.     Comments: CN 2-12 intact, no ataxia on finger to nose, R horizontal fatigable nystagmus, 5/5 strength throughout, no pronator drift, Romberg negative, normal gait. Head impulse test negative, test of skew negative  Psychiatric:        Behavior: Behavior normal.      ED Treatments / Results  Labs (all labs ordered are listed, but only abnormal results are displayed) Labs Reviewed  CBC WITH DIFFERENTIAL/PLATELET - Abnormal; Notable for the following components:      Result Value   Hemoglobin 10.3 (*)    HCT 32.6 (*)    MCH 25.6 (*)    Platelets 456 (*)    All other components within normal limits  BASIC METABOLIC PANEL - Abnormal; Notable for the following components:   Potassium 3.1 (*)    Glucose, Bld 112 (*)    Calcium 8.7 (*)    All other components within normal limits  D-DIMER, QUANTITATIVE (NOT AT [facility]) - Abnormal; Notable for the following components:   D-Dimer, Quant 0.94 (*)    All other components within normal limits  TROPONIN I (HIGH SENSITIVITY)  TROPONIN I (HIGH SENSITIVITY)    EKG EKG Interpretation  Date/Time:  Thursday October 04 2019 16:12:40 EST Ventricular Rate:  136 PR Interval:    QRS Duration: 81 QT Interval:  280 QTC Calculation: 422 R Axis:   93 Text Interpretation: Sinus or ectopic atrial tachycardia Borderline right axis deviation Borderline repolarization abnormality No previous ECGs available Confirmed by Ezequiel Essex [phone]) on 10/04/2019 4:15:13 PM   Radiology Ct Head Wo Contrast  Result Date: 10/04/2019 CLINICAL DATA:  Chronic headache with dizziness EXAM: CT  HEAD WITHOUT CONTRAST TECHNIQUE: Contiguous axial images were obtained from the base of the skull through the vertex without intravenous contrast. COMPARISON:  None. FINDINGS: Brain: The ventricles are normal in size and configuration. There is no intracranial mass, hemorrhage, extra-axial fluid collection, or midline shift. Brain parenchyma appears unremarkable. No evident acute infarct. Vascular: There is no hyperdense vessel. There is no evident vascular calcification. Skull: Bony calvarium appears intact. Sinuses/Orbits: There is mucosal thickening in several ethmoid air cells. Other visualized paranasal sinuses are clear. Note that frontal sinuses are virtually aplastic. Visualized orbits appear symmetric bilaterally. Other: Mastoid air cells are clear. IMPRESSION: Mucosal thickening in several ethmoid air cells. Study otherwise  unremarkable. Electronically Signed   By: Bretta BangWilliam  Woodruff III M.D.   On: 10/04/2019 16:03   Ct Angio Chest Pe W And/or Wo Contrast  Result Date: 10/04/2019 CLINICAL DATA:  Elevated D-dimer level. Dizziness. EXAM: CT ANGIOGRAPHY CHEST WITH CONTRAST TECHNIQUE: Multidetector CT imaging of the chest was performed using the standard protocol during bolus administration of intravenous contrast. Multiplanar CT image reconstructions and MIPs were obtained to evaluate the vascular anatomy. CONTRAST:  100mL OMNIPAQUE IOHEXOL 350 MG/ML SOLN COMPARISON:  None. FINDINGS: Cardiovascular: Satisfactory opacification of the pulmonary arteries to the segmental level. No evidence of pulmonary embolism. Normal heart size. No pericardial effusion. Mediastinum/Nodes: No enlarged mediastinal, hilar, or axillary lymph nodes. Thyroid gland, trachea, and esophagus demonstrate no significant findings. Lungs/Pleura: Lungs are clear. No pleural effusion or pneumothorax. Upper Abdomen: No acute abnormality. Musculoskeletal: No chest wall abnormality. No acute or significant osseous findings. Review of the MIP  images confirms the above findings. IMPRESSION: No definite evidence of pulmonary embolus. No acute cardiopulmonary abnormality seen. Electronically Signed   By: Lupita RaiderJames  Green Jr M.D.   On: 10/04/2019 18:46    Procedures Procedures (including critical care time)  Medications Ordered in ED Medications  ketorolac (TORADOL) injection 60 mg (has no administration in time range)  metoCLOPramide (REGLAN) injection 10 mg (has no administration in time range)  diphenhydrAMINE (BENADRYL) injection 25 mg (has no administration in time range)     Initial Impression / Assessment and Plan / ED Course  I have reviewed the triage vital signs and the nursing notes.  Pertinent labs & imaging results that were available during my care of the patient were reviewed by me and considered in my medical decision making (see chart for details).       Gradual onset headache similar to previous migraines now associated with nausea and some dizziness.  Her neurological exam is nonfocal.  No thunderclap onset.  Low suspicion for subarachnoid hemorrhage or meningitis.  CT head is negative but does show some ethmoid cell thickening.  Patient developed tachycardia, palpitations and anxiety following administration of Reglan and Benadryl.  She is given IV Ativan and Benadryl and IV fluids.  Patient much improved after IV fluids and Ativan.  Heart rate has normalized.  Suspect she had dystonic reaction to Reglan which was added to her allergy list.  Troponin and D-dimer are negative.  Her headache and dizziness have resolved.  She is tolerating p.o. and ambulatory.  CT shows no evidence of pulmonary embolism.  We will treat for possible component of sinusitis contributing to her headaches.  Advised follow-up with her PCP as well as neurology.  Return precautions discussed. Final Clinical Impressions(s) / ED Diagnoses   Final diagnoses:  Bad headache    ED Discharge Orders    None       Tritia Endo, Jeannett SeniorStephen,  MD 10/05/19 [id number]

## 2019-10-04 NOTE — ED Triage Notes (Addendum)
Pt c/o HA, dizziness x 3 days-denies injury-denies fever/flu like sx-NAD-steady gait

## 2019-10-04 NOTE — Discharge Instructions (Signed)
Your testing is reassuring.  Keep yourself hydrated and follow-up with your doctor as well as neurologist.  You experienced a reaction to a medication called Reglan and should not have this medication in the future. Return to the ED if you have worsening headache, weakness, numbness, difficulty speaking, difficulty swallowing or other concerns.

## 2019-10-04 NOTE — ED Notes (Signed)
Pt states she is feeling a little better.  HR has decreased.

## 2019-10-04 NOTE — ED Notes (Signed)
Pt states she feels like her heart is racing.  Applied Pulse ox, noted HR 150-160.  Pt states her heart is racing, feels like she is going to die.  Attempted vagal maneuver with pt, lowered HOB and raised legs as well.  Neither was successful.  Dr. Wyvonnia Dusky notified to come to bedside.  Applied cardiac monitor and VS applied.  Pt states her heart is racing, denies chest pain, not sob but doesn't feel well.  Pt also c/o of feeling like she cannot sit still.  Orders received from Dr. Wyvonnia Dusky.

## 2019-10-04 NOTE — ED Notes (Signed)
PT now lying flat for orthostatic vital signs

## 2019-10-04 NOTE — ED Notes (Signed)
H/A feels like prior migraines, except for the dizziness

## 2019-10-04 NOTE — ED Notes (Signed)
ED Provider at bedside. 

## 2019-10-05 MED FILL — DOXYCYCLINE HYCLATE 100 MG: 100 | 10 days supply | Qty: 20 | Fill #0

## 2019-10-10 ENCOUNTER — Encounter (HOSPITAL_BASED_OUTPATIENT_CLINIC_OR_DEPARTMENT_OTHER): Payer: Self-pay

## 2019-10-10 ENCOUNTER — Emergency Department (HOSPITAL_BASED_OUTPATIENT_CLINIC_OR_DEPARTMENT_OTHER)
Admission: EM | Admit: 2019-10-10 | Discharge: 2019-10-10 | Disposition: A | Discharge status: Home / Self Care | Payer: PRIVATE HEALTH INSURANCE | Attending: Emergency Medicine | Admitting: Emergency Medicine

## 2019-10-10 ENCOUNTER — Other Ambulatory Visit: Payer: Self-pay

## 2019-10-10 DIAGNOSIS — R42 Dizziness and giddiness: Secondary | ICD-10-CM | POA: Insufficient documentation

## 2019-10-10 DIAGNOSIS — Z888 Allergy status to other drugs, medicaments and biological substances status: Secondary | ICD-10-CM | POA: Insufficient documentation

## 2019-10-10 DIAGNOSIS — R519 Headache, unspecified: Secondary | ICD-10-CM | POA: Insufficient documentation

## 2019-10-10 LAB — PREGNANCY, URINE: Preg Test, Ur: NEGATIVE

## 2019-10-10 MED ORDER — DIPHENHYDRAMINE HCL 50 MG/ML IJ SOLN
12.5000 mg | Freq: Once | INTRAMUSCULAR | Status: AC
  Administered 2019-10-10: 12.5 mg via INTRAVENOUS
  Filled 2019-10-10: qty 1

## 2019-10-10 MED ORDER — PROCHLORPERAZINE EDISYLATE 10 MG/2ML IJ SOLN
10.0000 mg | Freq: Once | INTRAMUSCULAR | Status: AC
Start: 2019-10-10 — End: 2019-10-10
  Administered 2019-10-10: 22:00:00 10 mg via INTRAVENOUS
  Filled 2019-10-10: qty 2

## 2019-10-10 MED ORDER — LORAZEPAM 1 MG PO TABS
1.0000 mg | ORAL_TABLET | Freq: Once | ORAL | Status: DC
  Filled 2019-10-10: qty 1

## 2019-10-10 MED ORDER — SODIUM CHLORIDE 0.9 % IV BOLUS
1000.0000 mL | Freq: Once | INTRAVENOUS | Status: AC
  Administered 2019-10-10: 21:00:00 1000 mL via INTRAVENOUS

## 2019-10-10 MED ORDER — MECLIZINE HCL 25 MG PO TABS: 25.0000 mg | ORAL_TABLET | Freq: Three times a day (TID) | ORAL | 0 refills | Status: AC | PRN

## 2019-10-10 MED ORDER — LORAZEPAM 1 MG PO TABS
1.0000 mg | ORAL_TABLET | Freq: Once | ORAL | Status: AC
  Administered 2019-10-10: 1 mg via ORAL
  Filled 2019-10-10: qty 1

## 2019-10-10 MED ORDER — MECLIZINE HCL 25 MG PO TABS
25.0000 mg | ORAL_TABLET | Freq: Once | ORAL | Status: AC
  Administered 2019-10-10: 25 mg via ORAL
  Filled 2019-10-10: qty 1

## 2019-10-10 NOTE — ED Provider Notes (Addendum)
[facility] EMERGENCY DEPARTMENT Provider Note   CSN: [id number] Arrival date & time: 10/10/19  2045     History   Chief Complaint Chief Complaint  Patient presents with  . Headache    HPI Morgan Woodward is a 33 y.o. female abdomen with history of migraines who presents for 6 days of intermittent dizziness and headache.  Patient was seen here on 10/04/2019 for evaluation of similar symptoms.  She was discharged home after feeling better.  She reports that the next day, her symptoms returned.  She states that headache started out mild and gradually worsened.  No thunderclap headache.  No preceding trauma, injury, fall.  She states then, she has had a dull headache noted posteriorly.  She states that nothing better nothing makes it worse.  She also reports that she has had intermittent dizziness.  She states that this is worse when she gets up and moves around and when she walks.  She feels like the room is spinning around her.  She has had some associated nausea and photophobia.  She states that the headache is similar to what she is experienced with migraines and is usually associated with nausea and photophobia.  She has not had any vision changes, fever, chest pain, difficulty breathing, abdominal pain, numbness/weakness of her arms or legs.  She is not on any OCPs.  She denies any recent travel.     The history is provided by the patient.    Past Medical History:  Diagnosis Date  . Migraines     There are no active problems to display for this patient.   Past Surgical History:  Procedure Laterality Date  . CESAREAN SECTION    . TUBAL LIGATION       OB History   No obstetric history on file.      Home Medications    Prior to Admission medications   Medication Sig Start Date End Date Taking? Authorizing Provider  doxycycline (VIBRAMYCIN) 100 MG capsule Take 1 capsule (100 mg total) by mouth 2 (two) times daily. 10/04/19   Rancour, Jeannett SeniorStephen, MD  meclizine  (ANTIVERT) 25 MG tablet Take 1 tablet (25 mg total) by mouth 3 (three) times daily as needed for dizziness. 10/10/19   Maxwell CaulLayden, Loise Esguerra A, PA-C    Family History No family history on file.  Social History Social History   Tobacco Use  . Smoking status: Never Smoker  . Smokeless tobacco: Never Used  Substance Use Topics  . Alcohol use: No  . Drug use: No     Allergies   Compazine [prochlorperazine edisylate] and Reglan [metoclopramide]   Review of Systems Review of Systems  Constitutional: Negative for fever.  Respiratory: Negative for cough and shortness of breath.   Cardiovascular: Negative for chest pain.  Gastrointestinal: Negative for abdominal pain, nausea and vomiting.  Genitourinary: Negative for dysuria and hematuria.  Neurological: Positive for dizziness and headaches. Negative for weakness and numbness.  All other systems reviewed and are negative.    Physical Exam Updated Vital Signs BP 132/68   Pulse (!) 112   Temp 97.9 F (36.6 C) (Oral)   Resp 20   LMP 09/23/2019 (Approximate)   SpO2 96%   Physical Exam Vitals signs and nursing note reviewed.  Constitutional:      Appearance: Normal appearance. She is well-developed.  HENT:     Head: Normocephalic and atraumatic.     Right Ear: Tympanic membrane normal. No mastoid tenderness.     Left Ear: Tympanic  membrane normal. No mastoid tenderness.     Ears:     Comments: Bilateral TMs are without erythema, edema, effusion.  No tenderness palpation, erythema, edema noted mastoid process bilaterally. Eyes:     General: Lids are normal.     Conjunctiva/sclera: Conjunctivae normal.     Pupils: Pupils are equal, round, and reactive to light.     Comments: Horizontal nystagmus.  No vertical rotational nystagmus.  Neck:     Musculoskeletal: Full passive range of motion without pain and neck supple.     Vascular: No carotid bruit.     Comments: Neck is supple and without rigidity. No meningismus signs.   Cardiovascular:     Rate and Rhythm: Normal rate and regular rhythm.     Pulses: Normal pulses.          Radial pulses are 2+ on the right side and 2+ on the left side.     Heart sounds: Normal heart sounds. No murmur. No friction rub. No gallop.   Pulmonary:     Effort: Pulmonary effort is normal.     Breath sounds: Normal breath sounds.     Comments: Lungs clear to auscultation bilaterally.  Symmetric chest rise.  No wheezing, rales, rhonchi. Abdominal:     Palpations: Abdomen is soft. Abdomen is not rigid.     Tenderness: There is no abdominal tenderness. There is no guarding.  Musculoskeletal: Normal range of motion.  Skin:    General: Skin is warm and dry.     Capillary Refill: Capillary refill takes less than 2 seconds.  Neurological:     Mental Status: She is alert and oriented to person, place, and time.     Comments: Cranial nerves III-XII intact Follows commands, Moves all extremities  5/5 strength to BUE and BLE  Sensation intact throughout all major nerve distributions No gait ataxia but does report feeling slightly dizzy while ambulating No slurred speech. No facial droop.   Psychiatric:        Speech: Speech normal.      ED Treatments / Results  Labs (all labs ordered are listed, but only abnormal results are displayed) Labs Reviewed  PREGNANCY, URINE    EKG EKG Interpretation  Date/Time:  Wednesday October 10 2019 21:57:59 EST Ventricular Rate:  103 PR Interval:    QRS Duration: 82 QT Interval:  339 QTC Calculation: 444 R Axis:   87 Text Interpretation: Sinus tachycardia Nonspecific T wave abnormality Confirmed by Lajean Saver [phone]) on 10/10/2019 10:02:01 PM   Radiology No results found.  Procedures Procedures (including critical care time)  Medications Ordered in ED Medications  LORazepam (ATIVAN) tablet 1 mg (1 mg Oral Not Given 10/10/19 2300)  sodium chloride 0.9 % bolus 1,000 mL (0 mLs Intravenous Stopped 10/10/19 2235)  meclizine  (ANTIVERT) tablet 25 mg (25 mg Oral Given 10/10/19 2138)  prochlorperazine (COMPAZINE) injection 10 mg (10 mg Intravenous Given 10/10/19 2139)  diphenhydrAMINE (BENADRYL) injection 12.5 mg (12.5 mg Intravenous Given 10/10/19 2138)  LORazepam (ATIVAN) tablet 1 mg (1 mg Oral Given 10/10/19 2230)     Initial Impression / Assessment and Plan / ED Course  I have reviewed the triage vital signs and the nursing notes.  Pertinent labs & imaging results that were available during my care of the patient were reviewed by me and considered in my medical decision making (see chart for details).        33 year old female who presents for evaluation of headache and dizziness has been intermittently  occurring over the last 6 days.  Was seen here on 10/04/2019 for evaluation of same symptoms.  She does report feeling better but then symptoms returned.  They feels consistent with her migraine.  She describes room spinning sensation with dizziness.  Initially general, she is afebrile, nontoxic-appearing.  She slightly tachycardic but vitals otherwise stable.  No neuro deficits noted on exam.  She does have some horizontal nystagmus to the right noted.  Suspect vertigo.  History/physical exam concerning for CVA, dural venous thrombosis, meningitis.  Additionally, doubt cardiac etiology.  Will give medications and reassess.  Review of previous records.  She was seen on 10/04/2019 with reassuring work-up.  She did have positive D-dimer got a CTA which showed no evidence of PE.  Patient did react to the Reglan.  I will switch out Reglan for Compazine for a migraine cocktail.  RN informed me that patient was very anxious and was feeling palpitations.  I assessed patient and she felt like she was having palpitations.  I suspect this may be from Compazine.  Patient given Ativan.  Patient reviewed on the monitor.  Her heart rate fluctuates between high 90s into the 100s-110s.  When one of the nurses or I walk into the room,  causes her heart rate to go up higher and then it eventually settled back down to the high 90s afterwards.  I suspect there may be an anxiety component to this.  She did have D-dimer and CTA of chest done on her last visit which was negative for PE.  She does not have any chest pain or trouble breathing.  Patient feels like she cannot get comfortable here and is making her more anxious.  I offered patient additional Ativan but she does not wish to have any more medications.  She would like to go to bed at home and get more comfortable in her own place.  Patient denies any chest pain or difficulty breathing.  I personally ambulated patient with no signs of gait ataxia or abnormalities.  She states headache is completely gone and she does not have any more dizziness.  At this time, do not feel that her symptoms are result CVA, dural venous thrombosis.  We will plan to treat as vertigo with dose of meclizine.  Additionally, will give patient outpatient referral to neuro. At this time, patient exhibits no emergent life-threatening condition that require further evaluation in ED or admission. Discussed patient with Dr. Denton Lank who is agreeable to plan. Patient had ample opportunity for questions and discussion. All patient's questions were answered with full understanding.  Portions of this note were generated with Scientist, clinical (histocompatibility and immunogenetics). Dictation errors may occur despite best attempts at proofreading.   Final Clinical Impressions(s) / ED Diagnoses   Final diagnoses:  Generalized headache  Vertigo    ED Discharge Orders         Ordered    meclizine (ANTIVERT) 25 MG tablet  3 times daily PRN     10/10/19 2334           Maxwell Caul, PA-C 10/10/19 2338    Maxwell Caul, PA-C 10/10/19 3875    Cathren Laine, MD 10/14/19 [phone]

## 2019-10-10 NOTE — ED Triage Notes (Signed)
Pt c/o HA, dizziness x 1 week-states she was seen here for same-c/o behind both ears x today-NAD-steady gait

## 2019-10-10 NOTE — Discharge Instructions (Signed)
Take Meclizine for dizziness.   Follow-up with the referred outpatient neurology office.   Return to the Emergency Dept for any worsening pain, difficulty walking, vomiting, headache or any other worsening or concerning symptoms.

## 2021-08-26 IMAGING — CT CT ANGIO CHEST
2 of 8 series · 19 of 36 positions shown · IV contrast (omnipaque)
Comparison: None.

CLINICAL DATA: Elevated D-dimer level. Dizziness.

EXAM:
CT ANGIOGRAPHY CHEST WITH CONTRAST
TECHNIQUE: Multidetector CT imaging of the chest was performed using the
standard protocol during bolus administration of intravenous
contrast. Multiplanar CT image reconstructions and MIPs were
obtained to evaluate the vascular anatomy.
CONTRAST:  100mL OMNIPAQUE IOHEXOL 350 MG/ML SOLN

[Series 6: pe thins · axial · 0.81mm/px · z∈[-204,+26]mm · 18 of 257 slices shown]
[im 14/257  lung]
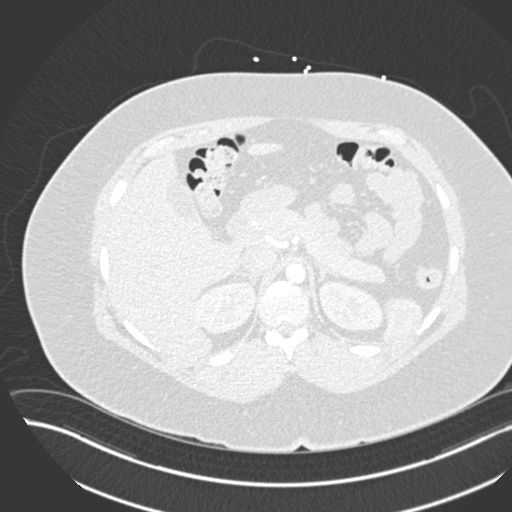
[im 27/257  mediastinal]
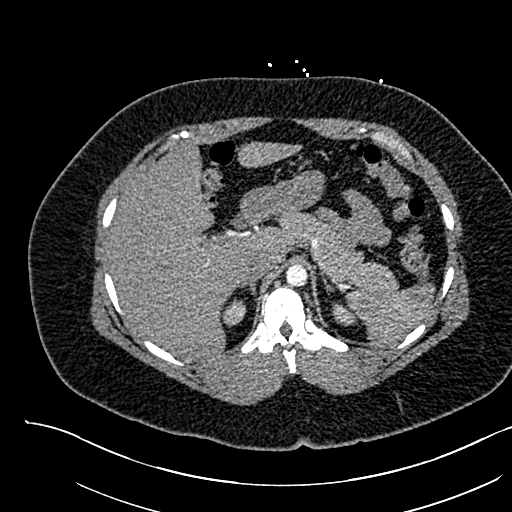
[im 41/257  lung]
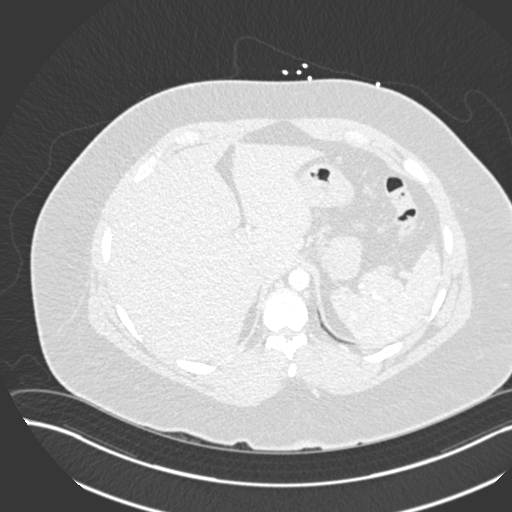
[im 54/257  mediastinal]
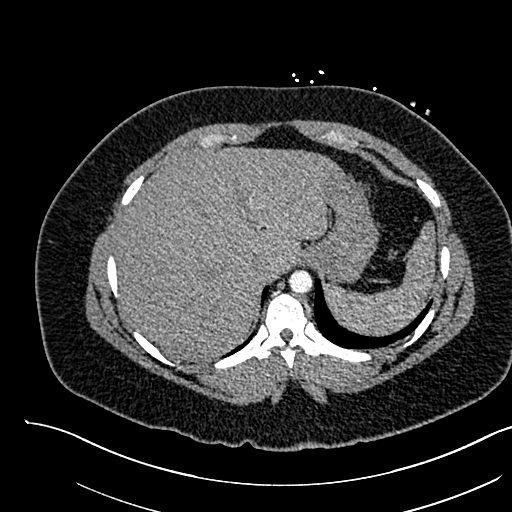
[im 68/257  lung]
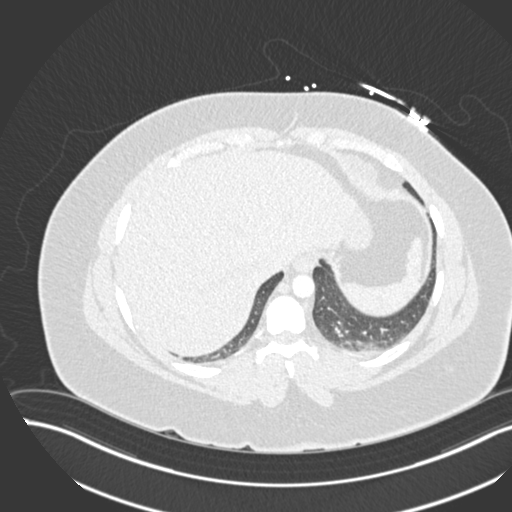
[im 81/257  mediastinal]
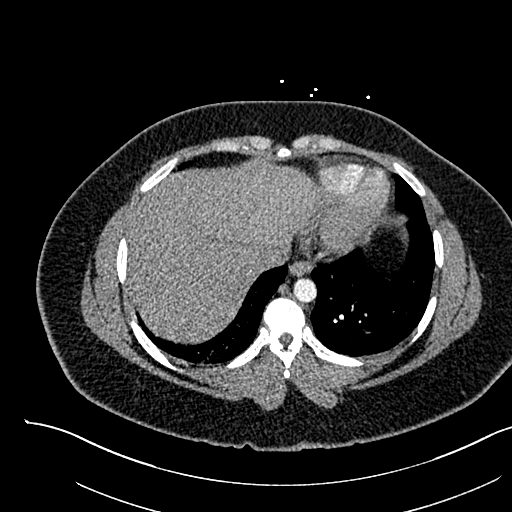
[im 95/257  lung]
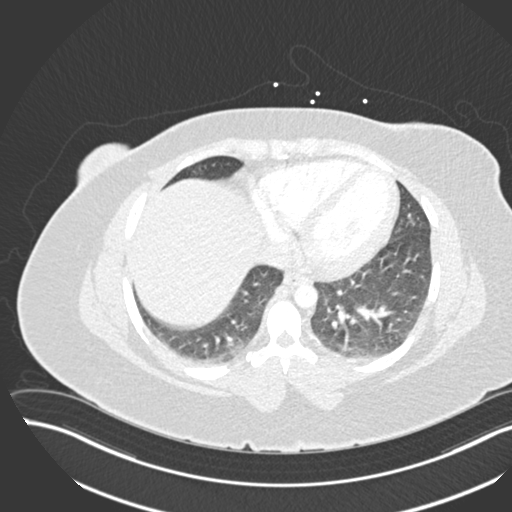
[im 108/257  mediastinal]
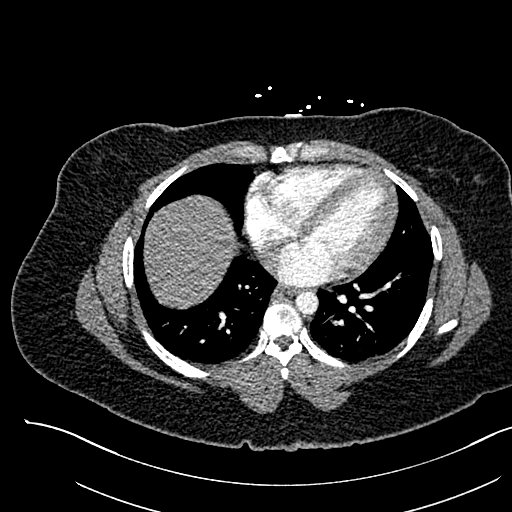
[im 122/257  lung]
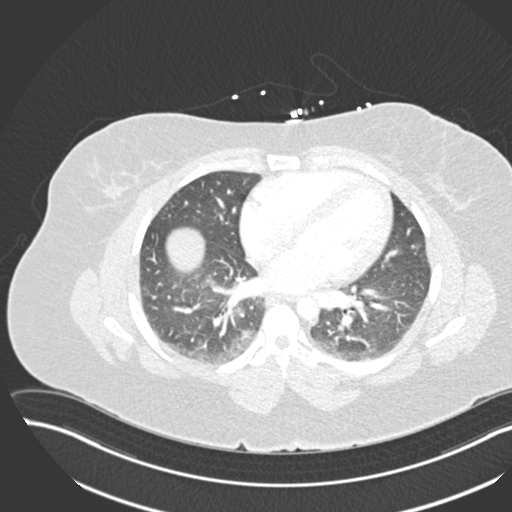
[im 135/257  mediastinal]
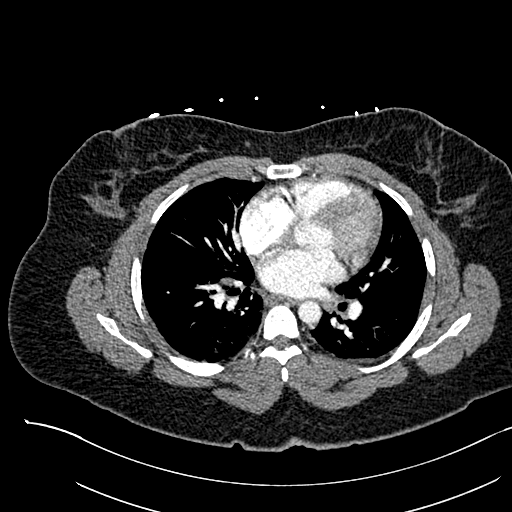
[im 149/257  lung]
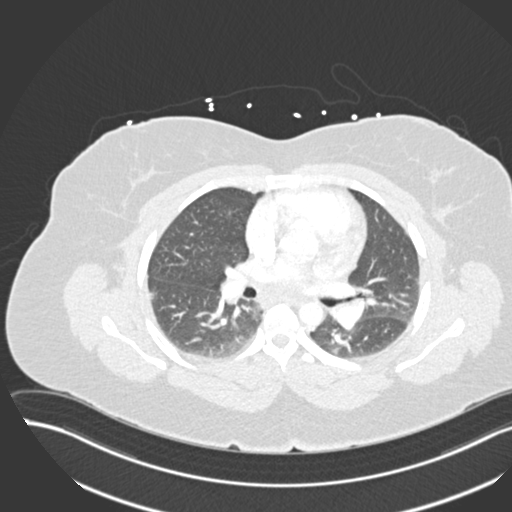
[im 162/257  mediastinal]
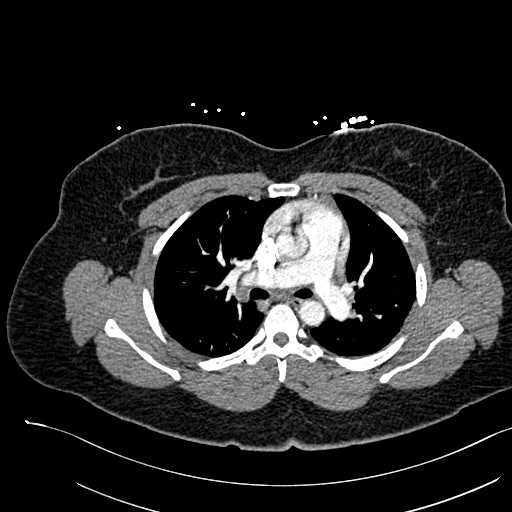
[im 176/257  lung]
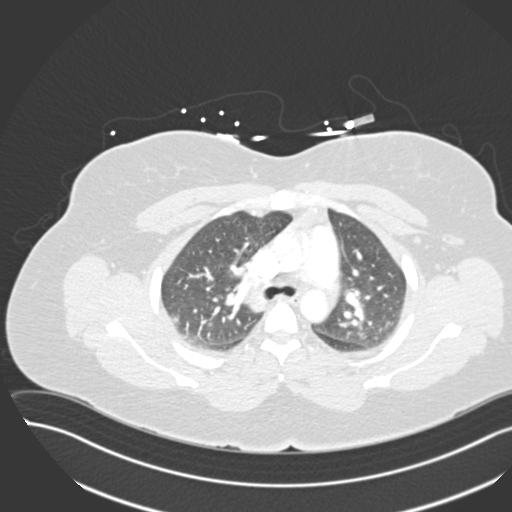
[im 189/257  mediastinal]
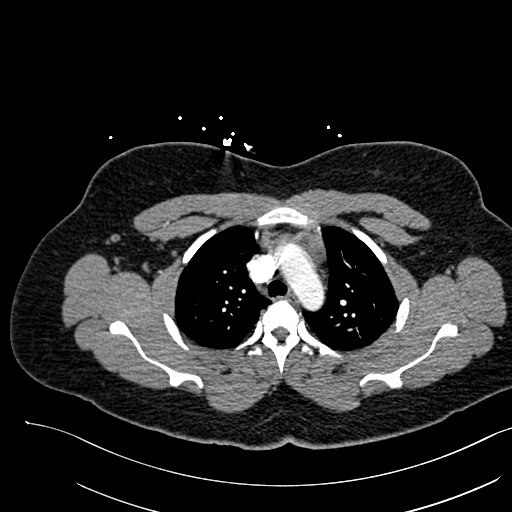
[im 203/257  lung]
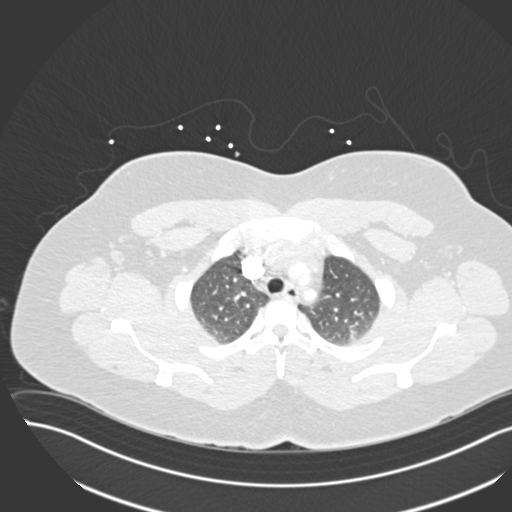
[im 216/257  mediastinal]
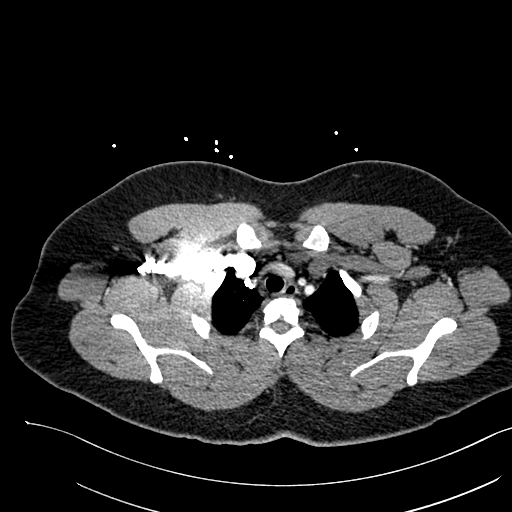
[im 230/257  lung]
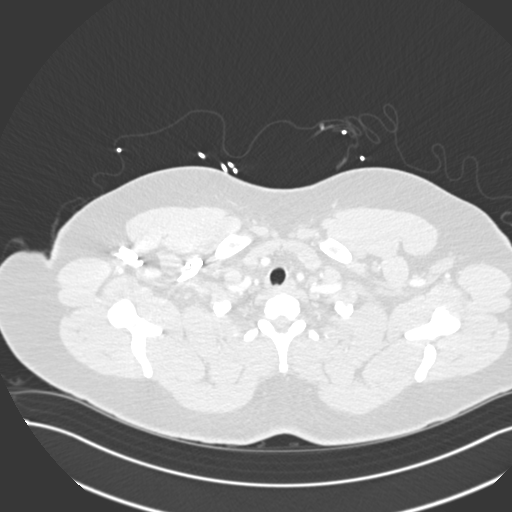
[im 243/257  mediastinal]
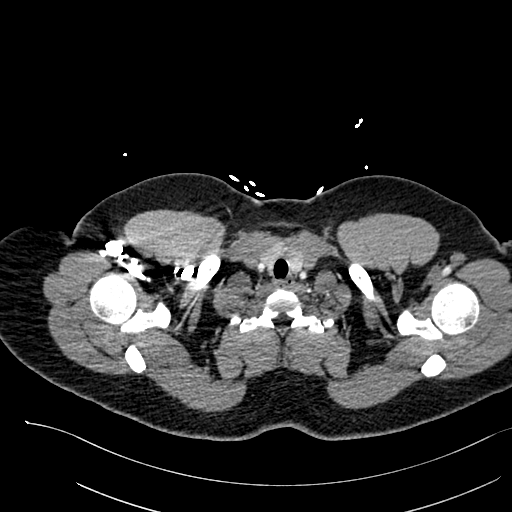

[Series 7: pe coronal mpr · coronal · 0.56mm/px · 1 of 151 slices shown]
[im 76/151  mediastinal]
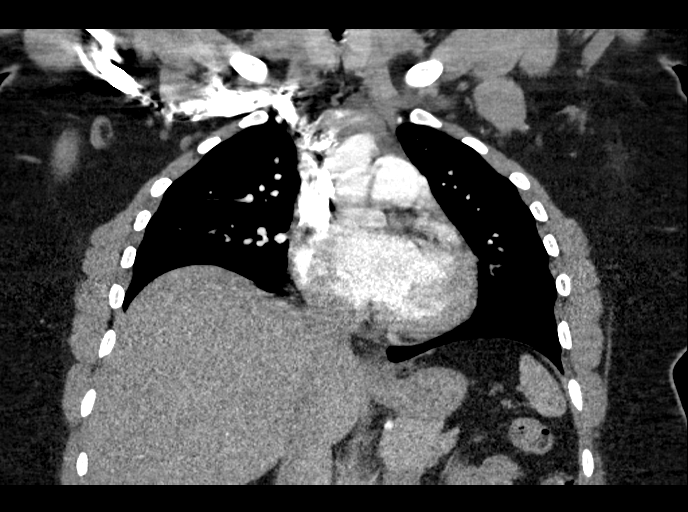

[19 of 36 positions shown; findings below may reference images not displayed]

FINDINGS: Cardiovascular: Satisfactory opacification of the pulmonary arteries
to the segmental level. No evidence of pulmonary embolism. Normal
heart size. No pericardial effusion.

Mediastinum/Nodes: No enlarged mediastinal, hilar, or axillary lymph
nodes. Thyroid gland, trachea, and esophagus demonstrate no
significant findings.

Lungs/Pleura: Lungs are clear. No pleural effusion or pneumothorax.

Upper Abdomen: No acute abnormality.

Musculoskeletal: No chest wall abnormality. No acute or significant
osseous findings.

Review of the MIP images confirms the above findings.
IMPRESSION: No definite evidence of pulmonary embolus. No acute cardiopulmonary
abnormality seen.
